# Patient Record
Sex: Male | Born: 1965 | Race: White | Hispanic: No | Marital: Married | Smoking: Former smoker
Health system: Southern US, Community
[De-identification: ages and names within clinical notes are randomized; demographics above are authoritative.]

---

## 2007-07-18 ENCOUNTER — Emergency Department: Payer: Self-pay | Admitting: Emergency Medicine

## 2007-07-28 ENCOUNTER — Ambulatory Visit: Payer: Self-pay | Admitting: Internal Medicine

## 2014-07-29 ENCOUNTER — Emergency Department (HOSPITAL_COMMUNITY)
Admission: EM | Admit: 2014-07-29 | Discharge: 2014-07-29 | Disposition: A | Discharge status: Home / Self Care | Payer: BC Managed Care – PPO | Attending: Emergency Medicine | Admitting: Emergency Medicine

## 2014-07-29 NOTE — ED Notes (Signed)
Wrong patient registered s/t pt not providing correct birth date. Please disregard all charting which occurred on 07/29/2014.

## 2016-09-27 ENCOUNTER — Ambulatory Visit: Payer: Self-pay | Admitting: Family

## 2016-09-27 VITALS — BP 130/80 | HR 86 | Temp 97.9°F

## 2016-09-27 DIAGNOSIS — J019 Acute sinusitis, unspecified: Secondary | ICD-10-CM

## 2016-09-27 MED ORDER — AMOXICILLIN 875 MG PO TABS: 875.0000 mg | ORAL_TABLET | Freq: Two times a day (BID) | ORAL | 0 refills | Status: DC

## 2016-09-27 MED ORDER — FLUTICASONE PROPIONATE 50 MCG/ACT NA SUSP: 2.0000 | Freq: Every day | NASAL | 6 refills | Status: DC

## 2016-09-27 NOTE — Progress Notes (Signed)
S/ facial pressure and pain , fullness in ears  Some lightheadedness rolling over in bed, "like water in ears' Sneezing , HA, malaise , denies fever, body aches,cough CP SOB  Taking mucinex O/ mildly ill VSS ent tms dull retracted with fluid , nasal mucosa swollen , red, + max , frontal tenderness, throat injected , neck supple nontender Heart rsr lungs clear  A/ acute rhinosinusitis P amoxicillan, flonase.  Nasal saline products bid and prn Supportive measures discussed. Follow up prn not improving

## 2017-01-14 ENCOUNTER — Ambulatory Visit: Payer: Self-pay | Admitting: Physician Assistant

## 2017-01-14 VITALS — BP 150/85 | HR 70 | Temp 97.8°F

## 2017-01-14 DIAGNOSIS — J069 Acute upper respiratory infection, unspecified: Secondary | ICD-10-CM

## 2017-01-14 MED ORDER — AMOXICILLIN 875 MG PO TABS: 875.0000 mg | ORAL_TABLET | Freq: Two times a day (BID) | ORAL | 0 refills | Status: DC

## 2017-01-14 NOTE — Progress Notes (Signed)
S: C/o runny nose and congestion for 6 days, + fever, chills on Friday, felt tired and weak on Sat and Sun, denies cp/sob, v/d; mucus was green this am but clear throughout the day, cough is sporadic, mostly dry; several coworkers had the flu  Using otc meds: sudafed  O: PE: vitals w elevated bp, perrl eomi, normocephalic, tms dull, nasal mucosa red and swollen, throat injected, neck supple no lymph, lungs c t a, cv rrr, neuro intact  A:  Acute uri   P: drink fluids, continue regular meds , use otc meds of choice, return if not improving in 5 days, return earlier if worsening , reassurance, if worsening use amoxil [id number]

## 2017-10-17 ENCOUNTER — Ambulatory Visit: Payer: Self-pay | Admitting: Physician Assistant

## 2017-10-17 ENCOUNTER — Encounter: Payer: Self-pay | Admitting: Physician Assistant

## 2017-10-17 VITALS — BP 130/90 | HR 71 | Temp 97.1°F | Resp 16

## 2017-10-17 DIAGNOSIS — H8110 Benign paroxysmal vertigo, unspecified ear: Secondary | ICD-10-CM

## 2017-10-17 MED ORDER — MECLIZINE HCL 25 MG PO TABS: 25.0000 mg | ORAL_TABLET | Freq: Three times a day (TID) | ORAL | 0 refills | Status: AC | PRN

## 2017-10-17 NOTE — Progress Notes (Signed)
   Subjective: Vertigo     Patient ID: Rodney Mccarthy, male    DOB: 01/10/1966, 52 y.o.   MRN: [id number]  HPI Patient presents with onset of vertigo yesterday. Patient noticed upon a.m. awakening when he sat up in bed .Patient state dizziness continued throughout the day. Patient is asymptomatic when laying or sitting. A sudden change in position such as standing causes vertigo to return. Patient denies vision disturbance, nausea, or headache. No palliative measures for complaint   Review of Systems    negative except for complaint Objective:   Physical Exam HEENT unremarkable. Patient had negative orthostatic readings. Patient negative Romberg.       Assessment & Plan: Vertigo   Trial of Antivert for 1 week. Follow-up in one week if no improvement. We'll consider ENT if condition persists.

## 2021-08-16 ENCOUNTER — Other Ambulatory Visit: Payer: Self-pay | Admitting: Neurological Surgery

## 2021-08-16 DIAGNOSIS — M5416 Radiculopathy, lumbar region: Secondary | ICD-10-CM

## 2021-08-17 ENCOUNTER — Ambulatory Visit
Admission: RE | Admit: 2021-08-17 | Discharge: 2021-08-17 | Disposition: A | Discharge status: Home / Self Care | Payer: Managed Care, Other (non HMO) | Source: Ambulatory Visit | Attending: Neurological Surgery | Admitting: Neurological Surgery

## 2021-08-17 ENCOUNTER — Other Ambulatory Visit: Payer: Self-pay

## 2021-08-17 DIAGNOSIS — M5416 Radiculopathy, lumbar region: Secondary | ICD-10-CM | POA: Diagnosis present

## 2022-08-16 IMAGING — MR MR LUMBAR SPINE W/O CM
5 series · 31 of 48 positions shown · non-contrast
Comparison: None.

CLINICAL DATA: Lumbar radiculopathy

EXAM:
MRI LUMBAR SPINE WITHOUT CONTRAST
TECHNIQUE: Multiplanar, multisequence MR imaging of the lumbar spine was
performed. No intravenous contrast was administered.

[Series 5: T2 · sagittal · 4.0mm · 0.81mm/px · 6 of 17 slices shown (1 of 2)]
[im 1/17]
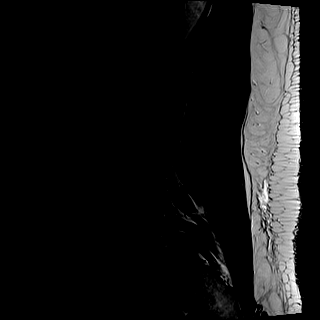
[im 4/17]
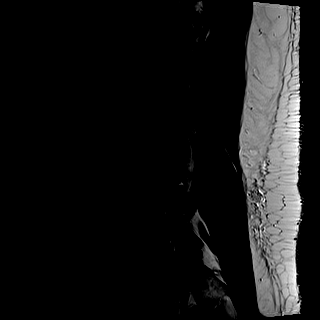
[im 7/17]
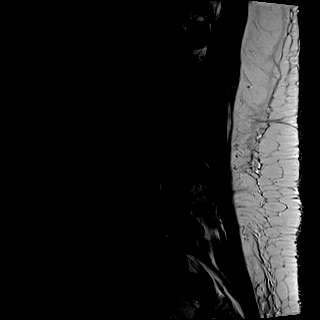
[im 10/17]
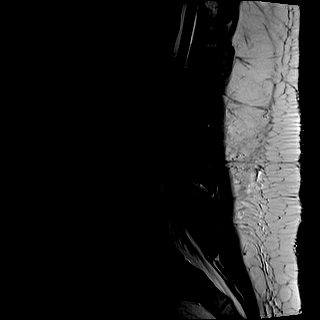
[im 13/17]
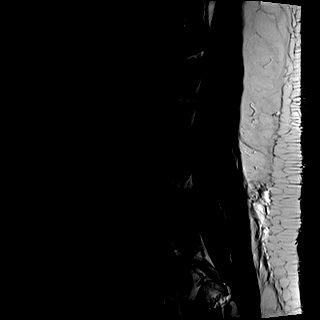
[im 17/17]
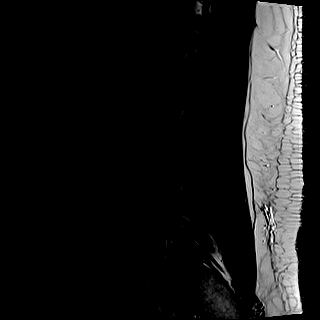

[Series 6: T1 · sagittal · 4.0mm · 0.81mm/px · 6 of 17 slices shown (1 of 2)]
[im 1/17]
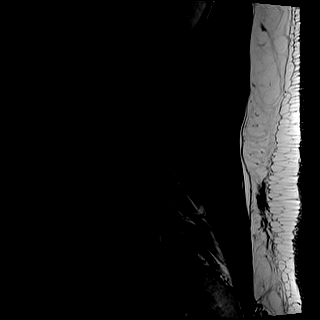
[im 4/17]
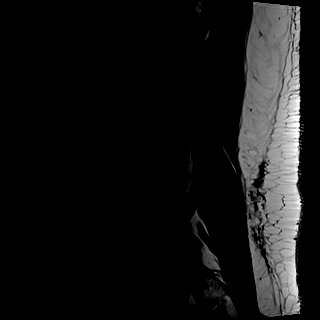
[im 7/17]
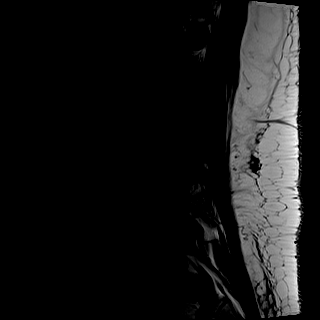
[im 10/17]
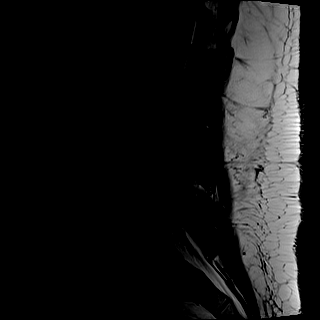
[im 13/17]
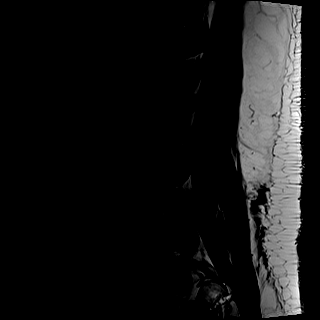
[im 17/17]
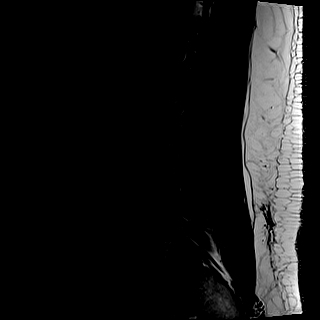

[Series 7: STIR · sagittal · 4.0mm · 0.41mm/px · 1 of 17 slices shown]
[im 1/17]
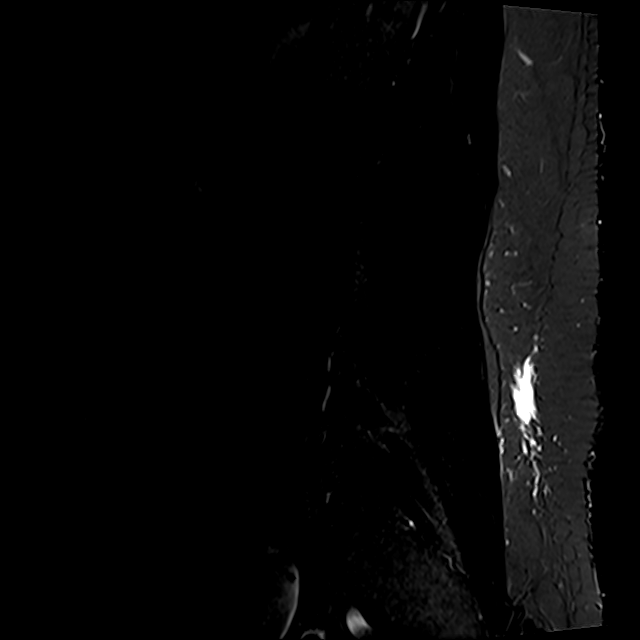

[Series 8: T2 · axial · 4.0mm · 0.78mm/px · z∈[-113,+133]mm · 9 of 42 slices shown (2 of 2)]
[im 1/42]
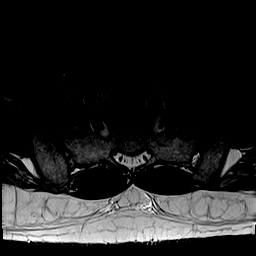
[im 6/42]
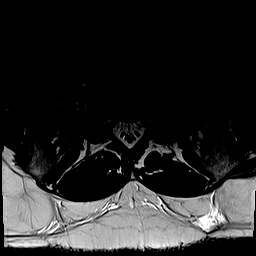
[im 12/42]
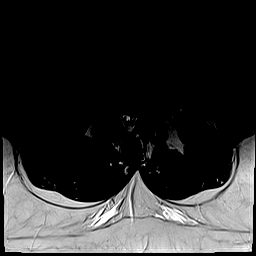
[im 18/42]
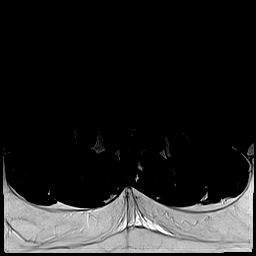
[im 21/42]
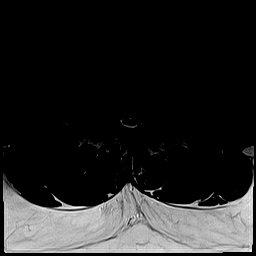
[im 24/42]
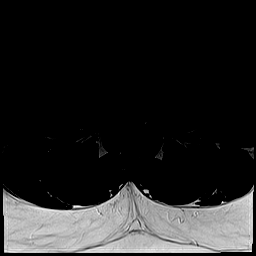
[im 30/42]
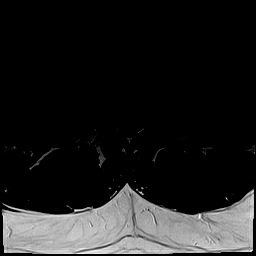
[im 36/42]
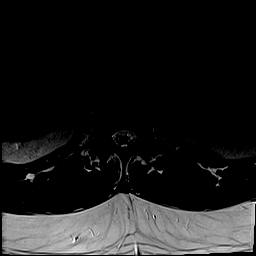
[im 42/42]
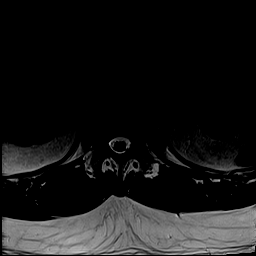

[Series 9: T1 · axial · 4.0mm · 0.39mm/px · z∈[-113,+133]mm · 9 of 42 slices shown (2 of 2)]
[im 1/42]
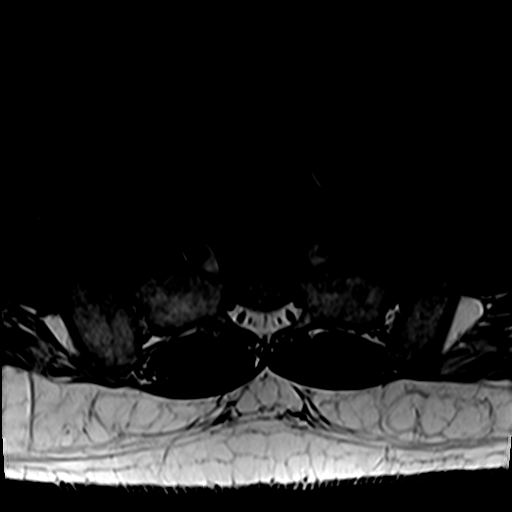
[im 6/42]
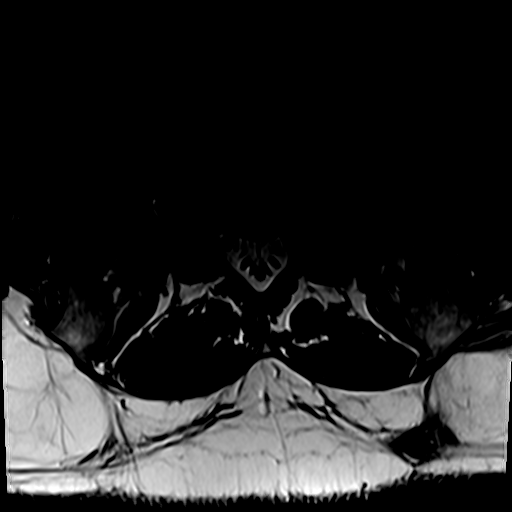
[im 12/42]
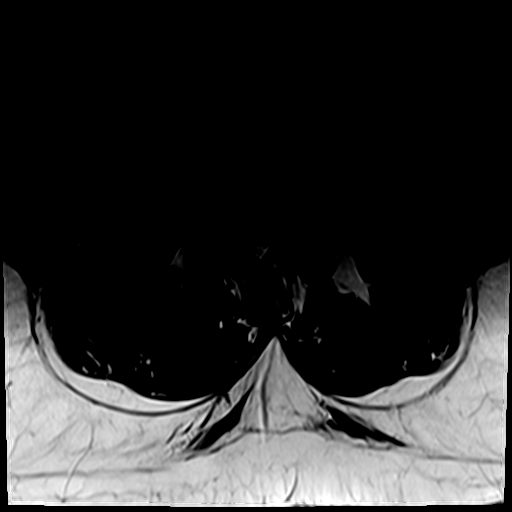
[im 18/42]
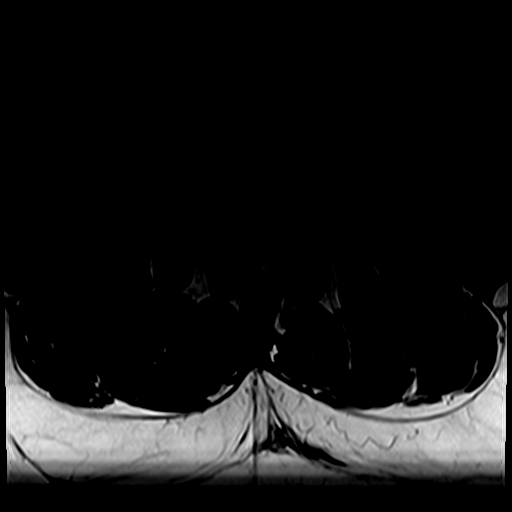
[im 21/42]
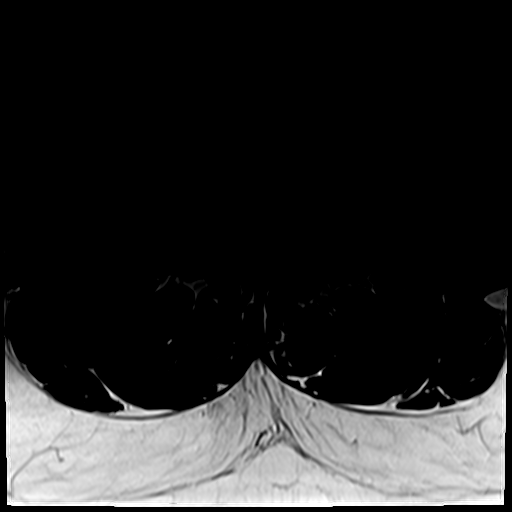
[im 24/42]
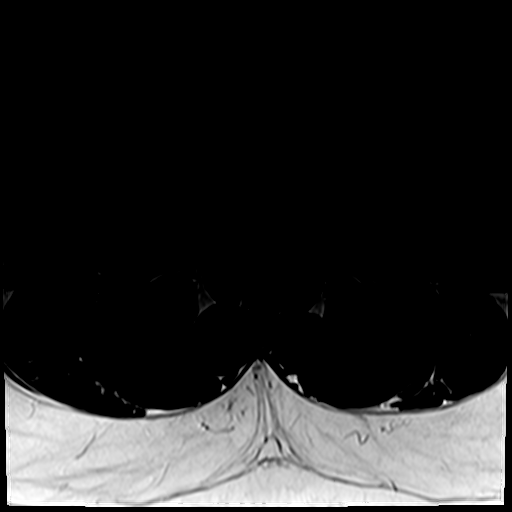
[im 30/42]
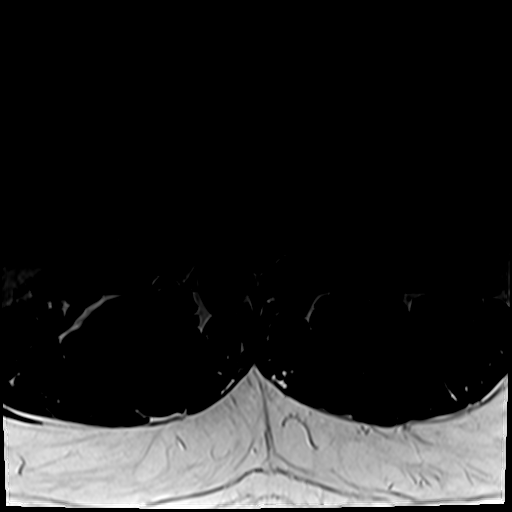
[im 36/42]
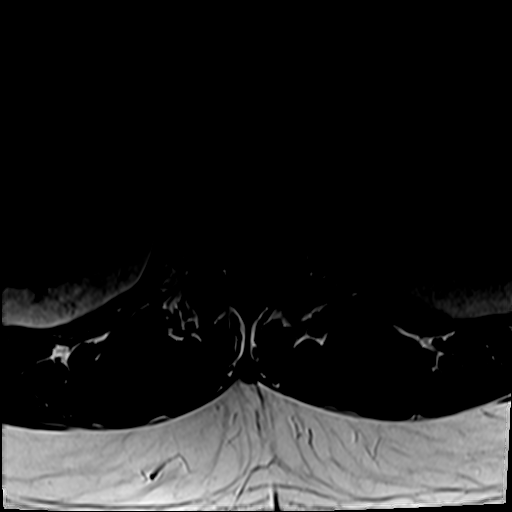
[im 42/42]
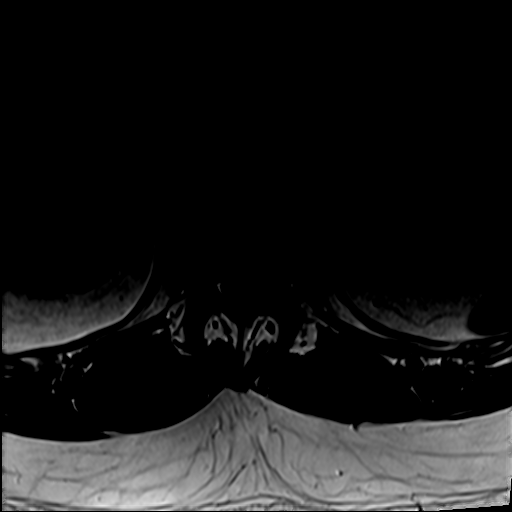

[31 of 48 positions shown; findings below may reference images not displayed]

FINDINGS: Segmentation:  5 lumbar vertebra

Alignment:  Mild anterolisthesis L3-4 and L4-5.

Vertebrae:  Normal bone marrow.  Negative for fracture or mass.

Conus medullaris and cauda equina: Conus extends to the L2 level.
Conus and cauda equina appear normal.

Paraspinal and other soft tissues: Negative for paraspinous mass,
adenopathy, or fluid collection.

Disc levels:

Congenital lumbar stenosis with short pedicles

L1-2: Right foraminal disc protrusion. Bilateral facet hypertrophy
causing mild spinal stenosis. Moderate subarticular and foraminal
stenosis on the right

L2-3: Severe spinal stenosis. Disc bulging with endplate spurring
and severe facet degeneration. Marked subarticular stenosis right
greater than left. Moderate right foraminal encroachment

L3-4: Severe spinal stenosis. Diffuse disc bulging and severe facet
degeneration. Moderate to severe subarticular stenosis bilaterally

L4-5: Disc degeneration and disc bulging. Shallow left-sided disc
protrusion. Bilateral facet hypertrophy. Mild spinal stenosis.
Moderate subarticular and foraminal stenosis on the left.

L5-S1: Negative
IMPRESSION: Multilevel lumbar degenerative change.  Negative for fracture.

Right disc protrusion L1-2 with moderate subarticular and foraminal
stenosis on the right

Severe spinal stenosis L2-3 and L3-4.

Mild spinal stenosis L4-5.
